# Patient Record
Sex: Female | Born: 1985 | Race: Black or African American | Hispanic: No | Marital: Single | Smoking: Never smoker
Health system: Southern US, Community
[De-identification: ages and names within clinical notes are randomized; demographics above are authoritative.]

## PROBLEM LIST (undated history)

## (undated) DIAGNOSIS — I10 Essential (primary) hypertension: Secondary | ICD-10-CM

## (undated) HISTORY — PX: CHOLECYSTECTOMY: SHX55

---

## 2006-12-06 ENCOUNTER — Emergency Department (HOSPITAL_COMMUNITY): Admission: EM | Admit: 2006-12-06 | Discharge: 2006-12-06 | Payer: Self-pay | Admitting: Family Medicine

## 2015-06-01 ENCOUNTER — Inpatient Hospital Stay (HOSPITAL_COMMUNITY)
Admission: AD | Admit: 2015-06-01 | Discharge: 2015-06-01 | Disposition: A | Discharge status: Home / Self Care | Payer: Medicaid - Out of State | Source: Ambulatory Visit | Attending: Obstetrics & Gynecology | Admitting: Obstetrics & Gynecology

## 2015-06-01 ENCOUNTER — Inpatient Hospital Stay (HOSPITAL_COMMUNITY): Payer: Medicaid - Out of State

## 2015-06-01 ENCOUNTER — Encounter (HOSPITAL_COMMUNITY): Payer: Self-pay

## 2015-06-01 DIAGNOSIS — D649 Anemia, unspecified: Secondary | ICD-10-CM

## 2015-06-01 DIAGNOSIS — N939 Abnormal uterine and vaginal bleeding, unspecified: Secondary | ICD-10-CM

## 2015-06-01 HISTORY — DX: Essential (primary) hypertension: I10

## 2015-06-01 LAB — CBC
HCT: 23.6 % — ABNORMAL LOW (ref 36.0–46.0)
Hemoglobin: 8 g/dL — ABNORMAL LOW (ref 12.0–15.0)
MCH: 26.1 pg (ref 26.0–34.0)
MCHC: 33.9 g/dL (ref 30.0–36.0)
MCV: 77.1 fL — ABNORMAL LOW (ref 78.0–100.0)
PLATELETS: 486 10*3/uL — AB (ref 150–400)
RBC: 3.06 MIL/uL — AB (ref 3.87–5.11)
RDW: 15 % (ref 11.5–15.5)
WBC: 7.5 10*3/uL (ref 4.0–10.5)

## 2015-06-01 LAB — TYPE AND SCREEN
ABO/RH(D): O POS
Antibody Screen: NEGATIVE

## 2015-06-01 LAB — HCG, QUANTITATIVE, PREGNANCY: HCG, BETA CHAIN, QUANT, S: 1 m[IU]/mL (ref <5)

## 2015-06-01 LAB — ABO/RH: ABO/RH(D): O POS

## 2015-06-01 MED ORDER — MEDROXYPROGESTERONE ACETATE 5 MG PO TABS: 10.0000 mg | ORAL_TABLET | Freq: Every day | ORAL | Status: AC

## 2015-06-01 NOTE — MAU Note (Signed)
Had chemical EAB in Aug, bled following taking the pills. Then started birthcontrol pack, started bleeding when finished pills- is still bleeding ~2nd week in September

## 2015-06-01 NOTE — MAU Provider Note (Signed)
History     CSN: [id number]  Arrival date and time: 06/01/15 1027   First Provider Initiated Contact with Patient 06/01/15 1123       Chief Complaint  Patient presents with  . Vaginal Bleeding   HPI  Victoria Haynes is a 29 y.o. female who presents for vaginal bleeding x 2 months s/p TAB.  Pt sent from [facility] by [facility]; hemoglobin was 7 today & complained of continued bleeding since TAB in August. Sent here for further evaluation.  TAB in [place] in mis August; states she was maybe 9 weeks pregnancy, they gave her medication to induce the abortion.  Light bleeding after that for a few weeks; bleeding stopped for 2 days then came back much heavier. States saturates large pads several times per day.  Denies abdominal pain.  Denies fever.  Denies chest pain, SOB, or dizziness.    OB History    Gravida Para Term Preterm AB TAB SAB Ectopic Multiple Living   Past Medical History  Diagnosis Date  . Hypertension     Past Surgical History  Procedure Laterality Date  . Cesarean section    . Cholecystectomy      No family history on file.  Social History  Substance Use Topics  . Smoking status: Not on file  . Smokeless tobacco: Not on file  . Alcohol Use: Not on file    Allergies: No Known Allergies  No prescriptions prior to admission    Review of Systems  Constitutional: Negative.   Respiratory: Negative.   Cardiovascular: Negative.   Gastrointestinal: Negative.   Genitourinary:       + vaginal bleeding  Neurological: Negative for dizziness and headaches.   Physical Exam   Blood pressure 126/89, pulse 94, temperature 99.5 F (37.5 C), temperature source Oral, resp. rate 18, height  (1.473 m), weight 245 lb (111.131 kg).  Physical Exam  Nursing note and vitals reviewed. Constitutional: She is oriented to person, place, and time. She appears well-developed and well-nourished. No distress.  HENT:  Head:  Normocephalic and atraumatic.  Eyes: Conjunctivae are normal. Right eye exhibits no discharge. Left eye exhibits no discharge. No scleral icterus.  Neck: Normal range of motion.  Cardiovascular: Normal rate, regular rhythm and normal heart sounds.   No murmur heard. Respiratory: Effort normal and breath sounds normal. No respiratory distress. She has no wheezes.  GI: Soft. Bowel sounds are normal. There is no tenderness.  Neurological: She is alert and oriented to person, place, and time.  Skin: Skin is warm and dry. She is not diaphoretic.  Psychiatric: She has a normal mood and affect. Her behavior is normal. Judgment and thought content normal.    MAU Course  Procedures Results for orders placed or performed during the hospital encounter of 06/01/15 (from the past 24 hour(s))  CBC     Status: Abnormal   Collection Time: 06/01/15 10:50 AM  Result Value Ref Range   WBC 7.5 4.0 - 10.5 K/uL   RBC 3.06 (L) 3.87 - 5.11 MIL/uL   Hemoglobin 8.0 (L) 12.0 - 15.0 g/dL   HCT 40.9 (L) 81.1 - 91.4 %   MCV 77.1 (L) 78.0 - 100.0 fL   MCH 26.1 26.0 - 34.0 pg   MCHC 33.9 30.0 - 36.0 g/dL   RDW 78.2 95.6 - 21.3 %   Platelets 486 (H) 150 - 400 K/uL  hCG, quantitative, pregnancy     Status: None   Collection Time: 06/01/15 10:50 AM  Result Value Ref Range   hCG, Beta Chain, Quant, S 1 <5 mIU/mL  Type and screen [facility]     Status: None   Collection Time: 06/01/15 10:50 AM  Result Value Ref Range   ABO/RH(D) O POS    Antibody Screen NEG    Sample Expiration 06/04/2015    [place] Transvaginal Non-ob  06/01/2015  CLINICAL DATA:  Abnormal vaginal bleeding EXAM: TRANSABDOMINAL AND TRANSVAGINAL ULTRASOUND OF PELVIS TECHNIQUE: Both transabdominal and transvaginal ultrasound examinations of the pelvis were performed. Transabdominal technique was performed for global imaging of the pelvis including uterus, ovaries, adnexal regions, and pelvic cul-de-sac. It was necessary to proceed  with endovaginal exam following the transabdominal exam to visualize the endometrium. COMPARISON:  None FINDINGS: Uterus Measurements: 10.5 x 5.6 x 7.2 cm. No fibroids or other mass visualized. Endometrium Thickness: 23 mm. Thickened/heterogeneous, without focal color Doppler flow. Right ovary Measurements: 3.4 x 2.8 x 2.1 cm. Normal appearance/no adnexal mass. Left ovary Measurements: 3.4 x 2.6 x 2.6 cm. Normal appearance/no adnexal mass. Other findings No free fluid. IMPRESSION: Endometrial complex is mildly thickened/heterogeneous, measuring 23 mm. In the setting of menorrhagia, endometrial thickness is considered abnormal. Consider follow-up by US in 6-8 weeks, during the week immediately following menses (exam timing is critical). Electronically Signed   By: Charline BillsSriyesh  Krishnan M.D.   On: 06/01/2015 12:37   [place] Pelvis Complete  06/01/2015  CLINICAL DATA:  Abnormal vaginal bleeding EXAM: TRANSABDOMINAL AND TRANSVAGINAL ULTRASOUND OF PELVIS TECHNIQUE: Both transabdominal and transvaginal ultrasound examinations of the pelvis were performed. Transabdominal technique was performed for global imaging of the pelvis including uterus, ovaries, adnexal regions, and pelvic cul-de-sac. It was necessary to proceed with endovaginal exam following the transabdominal exam to visualize the endometrium. COMPARISON:  None FINDINGS: Uterus Measurements: 10.5 x 5.6 x 7.2 cm. No fibroids or other mass visualized. Endometrium Thickness: 23 mm. Thickened/heterogeneous, without focal color Doppler flow. Right ovary Measurements: 3.4 x 2.8 x 2.1 cm. Normal appearance/no adnexal mass. Left ovary Measurements: 3.4 x 2.6 x 2.6 cm. Normal appearance/no adnexal mass. Other findings No free fluid. IMPRESSION: Endometrial complex is mildly thickened/heterogeneous, measuring 23 mm. In the setting of menorrhagia, endometrial thickness is considered abnormal. Consider follow-up by US in 6-8 weeks, during the week immediately following menses  (exam timing is critical). Electronically Signed   By: Charline BillsSriyesh  Krishnan M.D.   On: 06/01/2015 12:37    MDM 1320- S/w Dr. Debroah LoopArnold. Reviewed pt presentation, ultrasound, & labs. Ok to discharge home on provera & iron supplements, f/u in clinic.   Assessment and Plan  A: 1. Abnormal vaginal bleeding   2. Vaginal bleeding problems   3. Anemia, unspecified anemia type    P: Discharge home Rx provera Take iron supplements (already has some at home)\ Iron rich diet instructions given Discussed reasons to return to MAU [facility]  Tyjuan Demetro, NP  06/01/2015, 10:45 AM

## 2015-06-01 NOTE — Discharge Instructions (Signed)
Abnormal Uterine Bleeding Abnormal uterine bleeding means bleeding from the vagina that is not your normal menstrual period. This can be:  Bleeding or spotting between periods.  Bleeding after sex (sexual intercourse).  Bleeding that is heavier or more than normal.  Periods that last longer than usual.  Bleeding after menopause. There are many problems that may cause this. Treatment will depend on the cause of the bleeding. Any kind of bleeding that is not normal should be reviewed by your doctor.  HOME CARE Watch your condition for any changes. These actions may lessen any discomfort you are having:  Do not use tampons or douches as told by your doctor.  Change your pads often. You should get regular pelvic exams and Pap tests. Keep all appointments for tests as told by your doctor. GET HELP IF:  You are bleeding for more than 1 week.  You feel dizzy at times. GET HELP RIGHT AWAY IF:   You pass out.  You have to change pads every 15 to 30 minutes.  You have belly pain.  You have a fever.  You become sweaty or weak.  You are passing large blood clots from the vagina.  You feel sick to your stomach (nauseous) and throw up (vomit). MAKE SURE YOU:  Understand these instructions.  Will watch your condition.  Will get help right away if you are not doing well or get worse.   This information is not intended to replace advice given to you by your health care provider. Make sure you discuss any questions you have with your health care provider.   Document Released: 06/01/2009 Document Revised: 08/09/2013 Document Reviewed: 03/03/2013 Elsevier Interactive Patient Education 2016 [employer].     Iron-Rich Diet Iron is a mineral that helps your body to produce hemoglobin. Hemoglobin is a protein in your red blood cells that carries oxygen to your body's tissues. Eating too little iron may cause you to feel weak and tired, and it can increase your risk for infection.  Eating enough iron is necessary for your body's metabolism, muscle function, and nervous system. Iron is naturally found in many foods. It can also be added to foods or fortified in foods. There are two types of dietary iron:  Heme iron. Heme iron is absorbed by the body more easily than nonheme iron. Heme iron is found in meat, poultry, and fish.  Nonheme iron. Nonheme iron is found in dietary supplements, iron-fortified grains, beans, and vegetables. You may need to follow an iron-rich diet if:  You have been diagnosed with iron deficiency or iron-deficiency anemia.  You have a condition that prevents you from absorbing dietary iron, such as:  Infection in your intestines.  Celiac disease. This involves long-lasting (chronic) inflammation of your intestines.  You do not eat enough iron.  You eat a diet that is high in foods that impair iron absorption.  You have lost a lot of blood.  You have heavy bleeding during your menstrual cycle.  You are pregnant. WHAT IS MY PLAN? Your health care provider may help you to determine how much iron you need per day based on your condition. Generally, when a person consumes sufficient amounts of iron in the diet, the following iron needs are met:  Men.  37-36 years old: 11 mg per day.  51-57 years old: 8 mg per day.  Women.   65-72 years old: 15 mg per day.  57-47 years old: 18 mg per day.  Over 44 years old: 73  mg per day.  Pregnant women: 27 mg per day.  Breastfeeding women: 9 mg per day. WHAT DO I NEED TO KNOW ABOUT AN IRON-RICH DIET?  Eat fresh fruits and vegetables that are high in vitamin C along with foods that are high in iron. This will help increase the amount of iron that your body absorbs from food, especially with foods containing nonheme iron. Foods that are high in vitamin C include oranges, peppers, tomatoes, and mango.  Take iron supplements only as directed by your health care provider. Overdose of iron can be  life-threatening. If you were prescribed iron supplements, take them with orange juice or a vitamin C supplement.  Cook foods in pots and pans that are made from iron.   Eat nonheme iron-containing foods alongside foods that are high in heme iron. This helps to improve your iron absorption.   Certain foods and drinks contain compounds that impair iron absorption. Avoid eating these foods in the same meal as iron-rich foods or with iron supplements. These include:  Coffee, black tea, and red wine.  Milk, dairy products, and foods that are high in calcium.  Beans, soybeans, and peas.  Whole grains.  When eating foods that contain both nonheme iron and compounds that impair iron absorption, follow these tips to absorb iron better.   Soak beans overnight before cooking.  Soak whole grains overnight and drain them before using.  Ferment flours before baking, such as using yeast in bread dough. WHAT FOODS CAN I EAT? Grains Iron-fortified breakfast cereal. Iron-fortified whole-wheat bread. Enriched rice. Sprouted grains. Vegetables Spinach. Potatoes with skin. Green peas. Broccoli. Red and green bell peppers. Fermented vegetables. Fruits Prunes. Raisins. Oranges. Strawberries. Mango. Grapefruit. Meats and Other Protein Sources Beef liver. Oysters. Beef. Shrimp. [place]. Chicken. [place]. Sardines. Chickpeas. Nuts. Tofu. Beverages Tomato juice. Fresh orange juice. Prune juice. Hibiscus tea. Fortified instant breakfast shakes. Condiments Tahini. Fermented soy sauce. Sweets and Desserts Black-strap molasses.  Other Wheat germ. The items listed above may not be a complete list of recommended foods or beverages. Contact your dietitian for more options. WHAT FOODS ARE NOT RECOMMENDED? Grains Whole grains. Bran cereal. Bran flour. Oats. Vegetables Artichokes. Brussels sprouts. Kale. Fruits Blueberries. Raspberries. Strawberries. Figs. Meats and Other Protein  Sources Soybeans. Products made from soy protein. Dairy Milk. Cream. Cheese. Yogurt. Cottage cheese. Beverages Coffee. Black tea. Red wine. Sweets and Desserts Cocoa. Chocolate. Ice cream. Other Basil. Oregano. Parsley. The items listed above may not be a complete list of foods and beverages to avoid. Contact your dietitian for more information.   This information is not intended to replace advice given to you by your health care provider. Make sure you discuss any questions you have with your health care provider.   Document Released: 03/18/2005 Document Revised: 08/25/2014 Document Reviewed: 03/01/2014 Elsevier Interactive Patient Education [employer].

## 2015-07-11 ENCOUNTER — Encounter: Payer: Medicaid - Out of State | Admitting: Obstetrics and Gynecology

## 2016-12-26 IMAGING — US US PELVIS COMPLETE
1 series · 15 of 25 positions shown · non-contrast
Comparison: None

CLINICAL DATA: Abnormal vaginal bleeding



[Series 1: us pelvis complete · 15 of 47 slices shown]
[im 1/47]
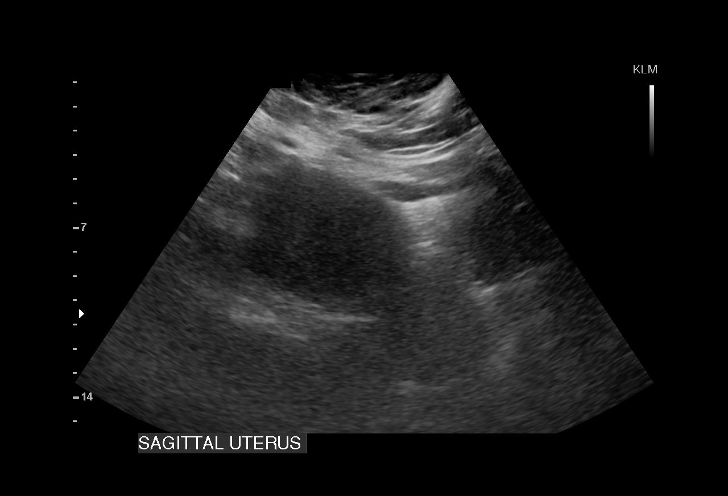
[im 4/47]
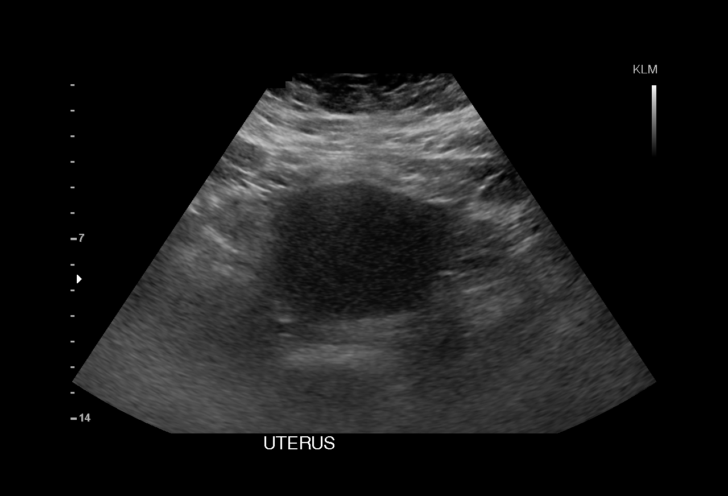
[im 8/47]
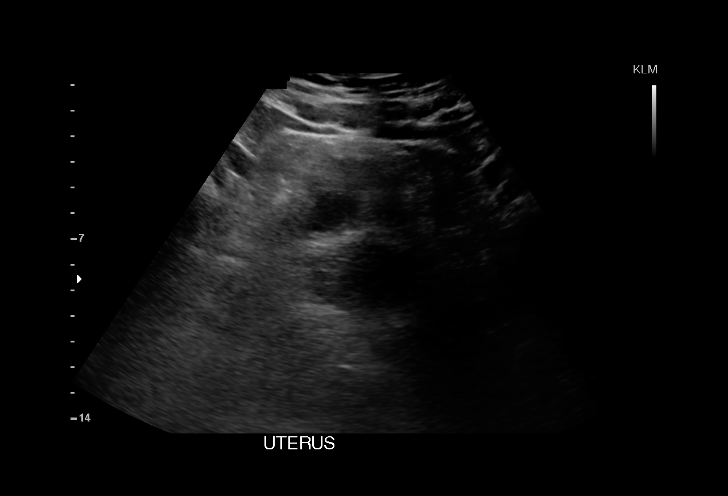
[im 10/47]
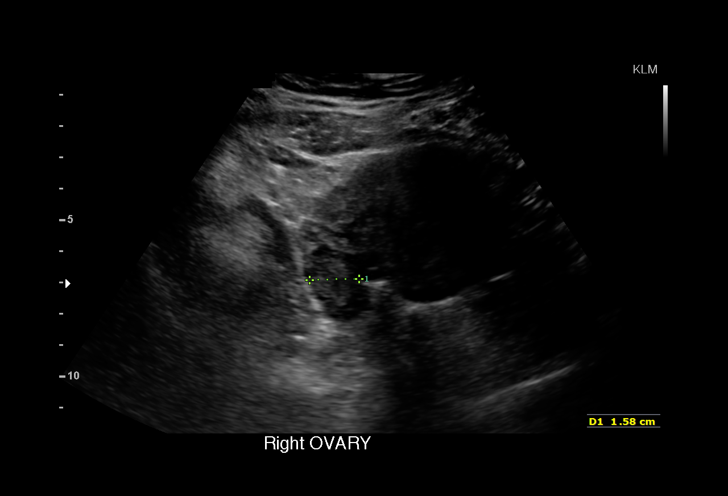
[im 14/47]
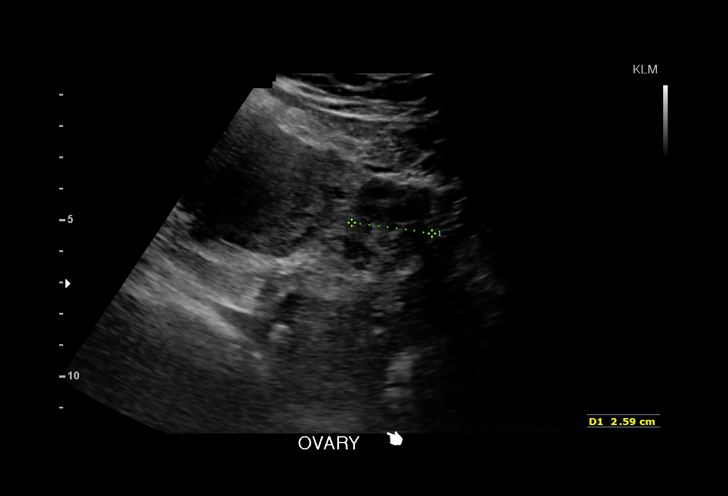
[im 18/47]
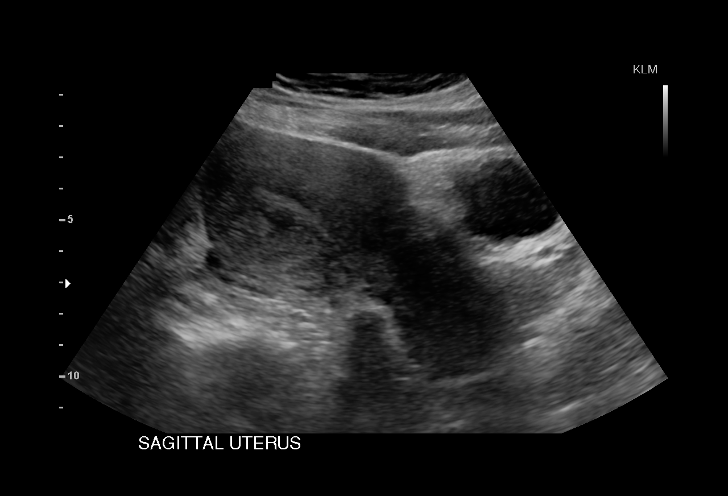
[im 20/47]
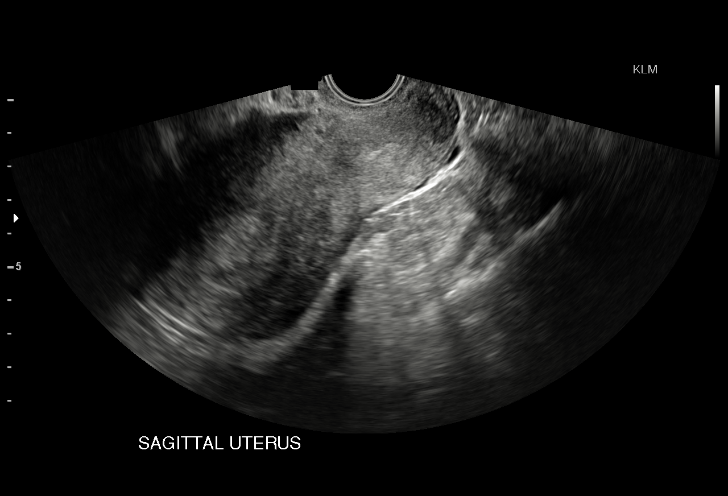
[im 24/47]
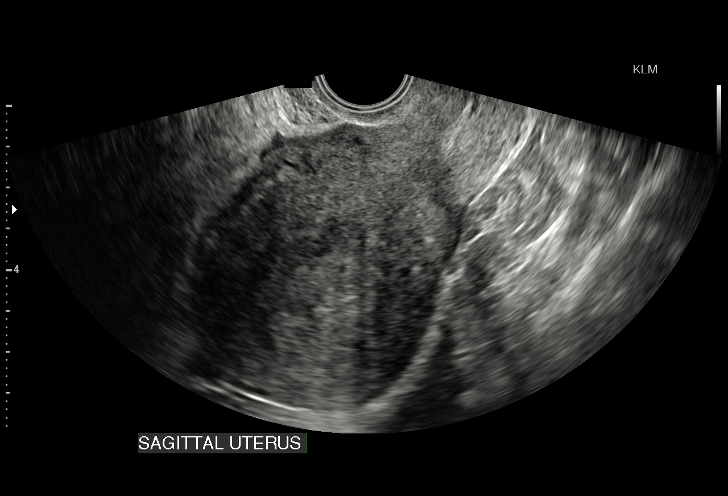
[im 27/47]
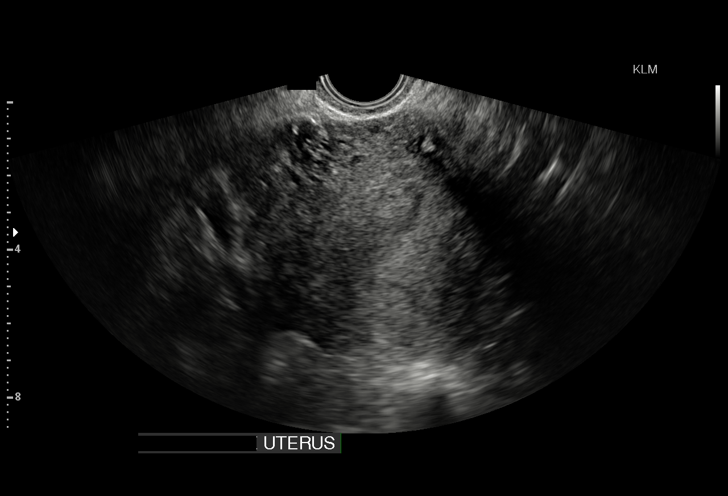
[im 29/47]
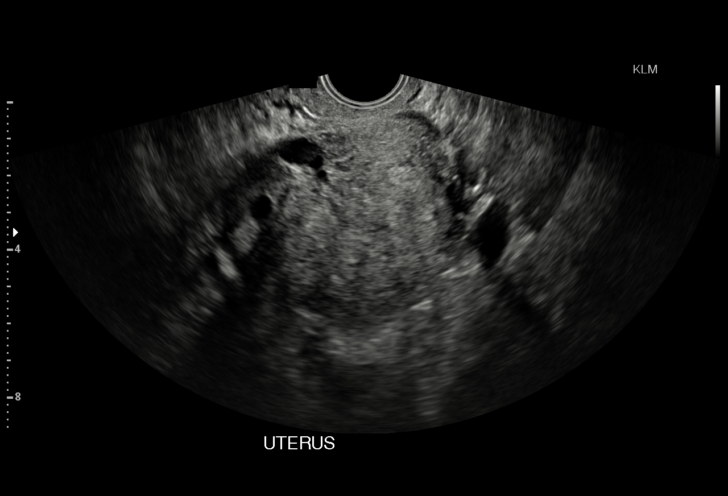
[im 33/47]
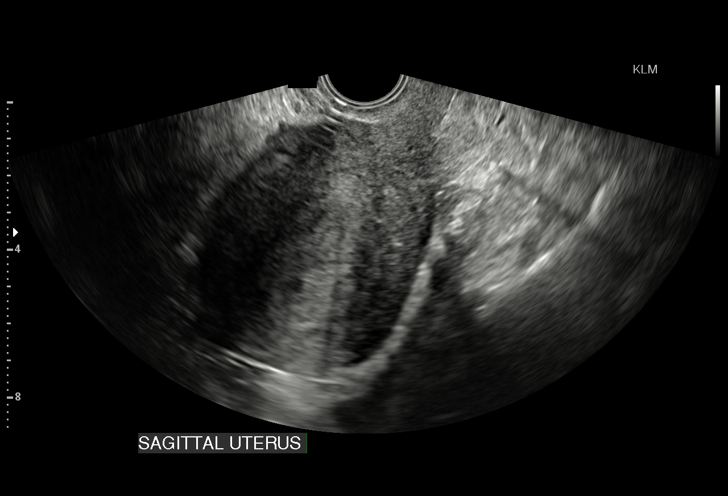
[im 37/47]
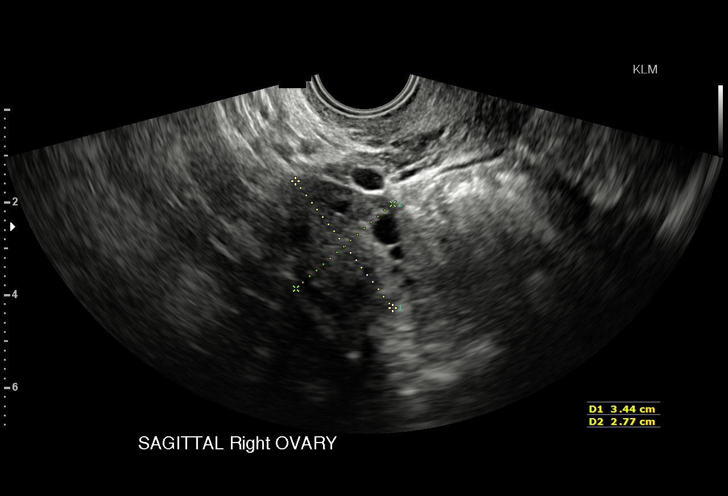
[im 39/47]
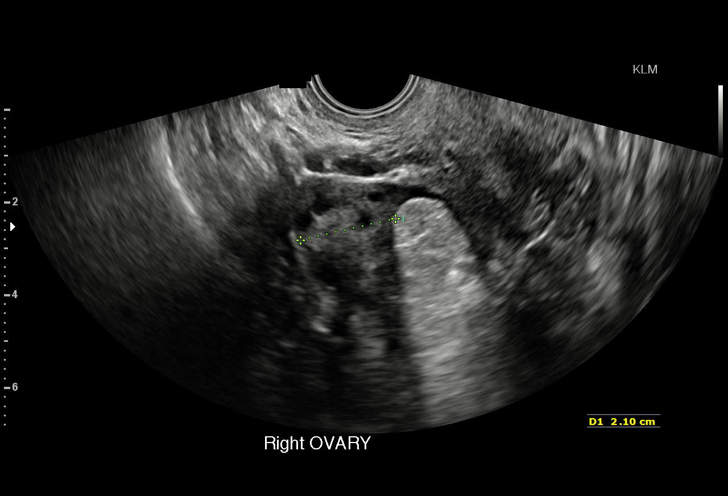
[im 43/47]
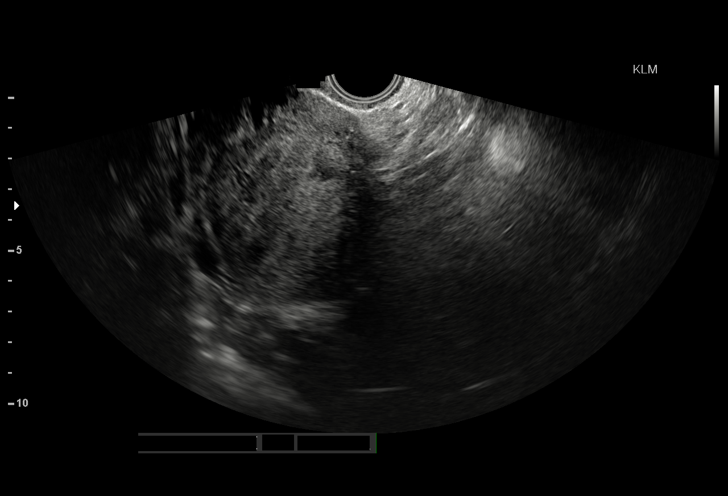
[im 47/47]
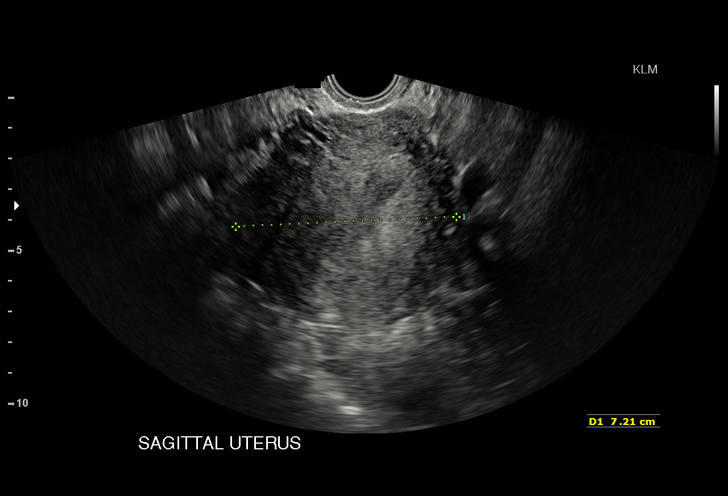

[15 of 25 positions shown; findings below may reference images not displayed]

FINDINGS: Uterus

Measurements: 10.5 x 5.6 x 7.2 cm. No fibroids or other mass
visualized.

Endometrium

Thickness: 23 mm. Thickened/heterogeneous, without focal color
Doppler flow.

Right ovary

Measurements: 3.4 x 2.8 x 2.1 cm. Normal appearance/no adnexal mass.

Left ovary

Measurements: 3.4 x 2.6 x 2.6 cm. Normal appearance/no adnexal mass.

Other findings

No free fluid.
IMPRESSION: Endometrial complex is mildly thickened/heterogeneous, measuring 23
mm.

In the setting of menorrhagia, endometrial thickness is considered
abnormal. Consider follow-up by US in 6-8 weeks, during the week
immediately following menses (exam timing is critical).

## 2021-07-09 ENCOUNTER — Telehealth: Payer: Self-pay

## 2021-07-09 NOTE — Telephone Encounter (Signed)
Transition Care Management Unsuccessful Follow-up Telephone Call  Date of discharge and from where:  07/08/2021 from [facility]   Attempts:  1st Attempt  Reason for unsuccessful TCM follow-up call:  Unable to leave message    

## 2021-07-10 NOTE — Telephone Encounter (Signed)
Transition Care Management Unsuccessful Follow-up Telephone Call  Date of discharge and from where:  07/08/2021 from [facility]  Attempts:  2nd Attempt  Reason for unsuccessful TCM follow-up call:  Unable to leave message

## 2021-07-12 NOTE — Telephone Encounter (Signed)
Transition Care Management Unsuccessful Follow-up Telephone Call  Date of discharge and from where:  07/08/2021-[facility]   Attempts:  3rd Attempt  Reason for unsuccessful TCM follow-up call:  Unable to leave message

## 2022-07-02 ENCOUNTER — Ambulatory Visit
Admission: RE | Admit: 2022-07-02 | Discharge: 2022-07-02 | Disposition: A | Discharge status: Home / Self Care | Payer: Medicaid Other | Source: Ambulatory Visit | Attending: Urgent Care | Admitting: Urgent Care

## 2022-07-02 VITALS — BP 152/91 | HR 87 | Temp 99.2°F | Resp 16

## 2022-07-02 DIAGNOSIS — Z20818 Contact with and (suspected) exposure to other bacterial communicable diseases: Secondary | ICD-10-CM

## 2022-07-02 DIAGNOSIS — J029 Acute pharyngitis, unspecified: Secondary | ICD-10-CM

## 2022-07-02 MED ORDER — AMOXICILLIN-POT CLAVULANATE 875-125 MG PO TABS: 1.0000 | ORAL_TABLET | Freq: Two times a day (BID) | ORAL | 0 refills | Status: AC

## 2022-07-02 MED ORDER — IBUPROFEN 600 MG PO TABS: 600.0000 mg | ORAL_TABLET | Freq: Four times a day (QID) | ORAL | 0 refills | Status: AC | PRN

## 2022-07-02 NOTE — ED Provider Notes (Signed)
[facility]  Note:  This document was prepared using Conservation officer, historic buildings and may include unintentional dictation errors.  MRN: [id number] DOB: [birth year]  Subjective:   Victoria Haynes is a 37 y.o. female presenting for 1 to 2-day history of acute onset persistent throat pain, painful swallowing, having more trouble today including controlling her secretions.  Patient feels like she needs to keep spitting into a bag.  No runny or stuffy nose, cough, chest pain, shortness of breath or wheezing.  Has felt feverish.  She did bring her daughter in recently and got tested for strep which was positive.  No current facility-administered medications for this encounter.  Current Outpatient Medications:    acetaminophen (TYLENOL) 500 MG tablet, Take 500 mg by mouth every 6 (six) hours as needed for moderate pain., Disp: , Rfl:    amLODipine (NORVASC) 10 MG tablet, Take 10 mg by mouth daily., Disp: , Rfl:    bisoprolol-hydrochlorothiazide (ZIAC) 10-6.25 MG tablet, Take 1 tablet by mouth daily., Disp: , Rfl:    fexofenadine-pseudoephedrine (ALLEGRA-D 24) 180-240 MG 24 hr tablet, Take 1 tablet by mouth daily as needed (congestion)., Disp: , Rfl:    IRON PO, Take 1 tablet by mouth daily., Disp: , Rfl:    medroxyPROGESTERone (PROVERA) 5 MG tablet, Take 2 tablets (10 mg total) by mouth daily., Disp: 20 tablet, Rfl: 0   No Known Allergies  Past Medical History:  Diagnosis Date   Hypertension      Past Surgical History:  Procedure Laterality Date   CESAREAN SECTION     CHOLECYSTECTOMY      History reviewed. No pertinent family history.  Social History   Tobacco Use   Smoking status: Never  Substance Use Topics   Alcohol use: No   Drug use: No    ROS   Objective:   Vitals: BP (!) 152/91 (BP Location: Right Arm)   Pulse 87   Temp 99.2 F (37.3 C) (Oral)   Resp 16   LMP 07/02/2022   SpO2 98%   Breastfeeding Yes   Physical  Exam Constitutional:      General: She is not in acute distress.    Appearance: Normal appearance. She is well-developed. She is not ill-appearing, toxic-appearing or diaphoretic.  HENT:     Head: Normocephalic and atraumatic.     Nose: Nose normal.     Mouth/Throat:     Mouth: Mucous membranes are moist.     Pharynx: Pharyngeal swelling, posterior oropharyngeal erythema and uvula swelling present. No oropharyngeal exudate.     Tonsils: No tonsillar exudate or tonsillar abscesses. 2+ on the right. 2+ on the left.  Eyes:     General: No scleral icterus.       Right eye: No discharge.        Left eye: No discharge.     Extraocular Movements: Extraocular movements intact.  Cardiovascular:     Rate and Rhythm: Normal rate.  Pulmonary:     Effort: Pulmonary effort is normal.  Skin:    General: Skin is warm and dry.  Neurological:     General: No focal deficit present.     Mental Status: She is alert and oriented to person, place, and time.  Psychiatric:        Mood and Affect: Mood normal.        Behavior: Behavior normal.      Assessment and Plan :   PDMP not reviewed this encounter.  1.  Acute pharyngitis, unspecified etiology   2. Strep throat exposure     Will treat empirically for pharyngitis given physical exam findings and close strep throat exposure.  Patient is to start Augmentin, use supportive care otherwise. Counseled patient on potential for adverse effects with medications prescribed/recommended today, ER and return-to-clinic precautions discussed, patient verbalized understanding.    Jaynee Eagles, PA-C 07/02/22 1749

## 2022-07-02 NOTE — ED Triage Notes (Signed)
Patient reports feeling a lump on the right side of her throat that started yesterday and worsened into today. Patient states she is having trouble swallowing and is currently frequently spitting into an emesis bag throughout triage. Denies difficulty breathing. Talking appears to be somewhat muffled. Describes the pain as an achy sore. States the left side of her throat is also sore, but that she only feels the lump on the right. No visible obstruction or tonsillar abscess on examination of posterior oropharynx in triage.
